# Patient Record
Sex: Male | Born: 1980 | Race: White | Hispanic: No | Marital: Married | Smoking: Never smoker
Health system: Southern US, Community
[De-identification: ages and names within clinical notes are randomized; demographics above are authoritative.]

## PROBLEM LIST (undated history)

## (undated) DIAGNOSIS — D649 Anemia, unspecified: Secondary | ICD-10-CM

## (undated) HISTORY — DX: Anemia, unspecified: D64.9

## (undated) HISTORY — PX: MUSCLE BIOPSY: SHX716

---
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
## 1999-10-04 ENCOUNTER — Encounter: Payer: Self-pay | Admitting: Emergency Medicine

## 1999-10-04 ENCOUNTER — Emergency Department (HOSPITAL_COMMUNITY): Admission: EM | Admit: 1999-10-04 | Discharge: 1999-10-04 | Payer: Self-pay | Admitting: Emergency Medicine

## 1999-11-05 ENCOUNTER — Encounter: Payer: Self-pay | Admitting: Emergency Medicine

## 1999-11-05 ENCOUNTER — Emergency Department (HOSPITAL_COMMUNITY): Admission: EM | Admit: 1999-11-05 | Discharge: 1999-11-05 | Payer: Self-pay | Admitting: Emergency Medicine

## 2003-05-15 ENCOUNTER — Emergency Department (HOSPITAL_COMMUNITY): Admission: EM | Admit: 2003-05-15 | Discharge: 2003-05-15 | Payer: Self-pay | Admitting: Emergency Medicine

## 2003-05-26 ENCOUNTER — Emergency Department (HOSPITAL_COMMUNITY): Admission: EM | Admit: 2003-05-26 | Discharge: 2003-05-27 | Payer: Self-pay | Admitting: Emergency Medicine

## 2003-12-21 ENCOUNTER — Other Ambulatory Visit: Payer: Self-pay

## 2004-01-29 ENCOUNTER — Emergency Department (HOSPITAL_COMMUNITY): Admission: AD | Admit: 2004-01-29 | Discharge: 2004-01-29 | Payer: Self-pay | Admitting: Family Medicine

## 2004-01-30 ENCOUNTER — Other Ambulatory Visit (HOSPITAL_COMMUNITY): Admission: RE | Admit: 2004-01-30 | Discharge: 2004-02-02 | Payer: Self-pay | Admitting: Psychiatry

## 2004-02-18 ENCOUNTER — Emergency Department (HOSPITAL_COMMUNITY): Admission: EM | Admit: 2004-02-18 | Discharge: 2004-02-18 | Payer: Self-pay | Admitting: Emergency Medicine

## 2004-04-09 ENCOUNTER — Emergency Department (HOSPITAL_COMMUNITY): Admission: EM | Admit: 2004-04-09 | Discharge: 2004-04-09 | Payer: Self-pay | Admitting: *Deleted

## 2004-07-25 ENCOUNTER — Emergency Department (HOSPITAL_COMMUNITY): Admission: EM | Admit: 2004-07-25 | Discharge: 2004-07-26 | Payer: Self-pay | Admitting: Emergency Medicine

## 2004-10-13 ENCOUNTER — Emergency Department: Payer: Self-pay | Admitting: Emergency Medicine

## 2005-04-13 ENCOUNTER — Emergency Department: Payer: Self-pay | Admitting: Internal Medicine

## 2005-07-16 ENCOUNTER — Emergency Department: Payer: Self-pay | Admitting: Emergency Medicine

## 2005-10-08 ENCOUNTER — Emergency Department: Payer: Self-pay | Admitting: Emergency Medicine

## 2006-06-16 ENCOUNTER — Emergency Department: Payer: Self-pay | Admitting: Emergency Medicine

## 2008-02-03 ENCOUNTER — Emergency Department: Payer: Self-pay | Admitting: Emergency Medicine

## 2008-02-03 ENCOUNTER — Other Ambulatory Visit: Payer: Self-pay

## 2008-02-08 ENCOUNTER — Ambulatory Visit: Payer: Self-pay | Admitting: Cardiology

## 2008-08-04 ENCOUNTER — Emergency Department: Payer: Self-pay | Admitting: Emergency Medicine

## 2008-10-01 ENCOUNTER — Inpatient Hospital Stay: Payer: Self-pay | Admitting: Internal Medicine

## 2009-10-31 ENCOUNTER — Ambulatory Visit: Payer: Self-pay | Admitting: Family Medicine

## 2010-01-15 ENCOUNTER — Emergency Department: Payer: Self-pay | Admitting: Emergency Medicine

## 2011-03-08 NOTE — Assessment & Plan Note (Signed)
[facility] OFFICE NOTE   Drew, [name]                      MRN:          [id number]  DATE:02/08/2008                            DOB:          June 21, 1981    Drew [name] comes in today to be introduced to [place] before we do a stress  adenosine Myoview at [facility] on Monday, February 11, 2008.   In brief, he is a 30 year old gentleman who presented to the emergency  room at [facility].  He complained of atypical chest pain.   His cardiac markers and D-dimer were negative.  He had a  normal EKG.  It was arranged through Dr. Myrtis [name] who was on call to have him set up for  a stress Myoview.   He is morbidly obese at 411.  He has no other risk factors at the  present time, except that he is fairly sedentary.  He fortunately does  not smoke and does not drink excessively.  He does not have hypertension  or diabetes yet.   MEDICATIONS:  His only medicine is fish 1 t.i.d.   PHYSICAL EXAMINATION:  VITAL SIGNS:  His blood pressure 110/60, pulse 84  and regular.  Weight is 411.  CARDIAC:  The rest of the exam is unremarkable, including a regular rate  and rhythm.  No obvious murmur.  LUNGS:  Clear.  EXTREMITIES:  Trace edema.  He has some brawny changes already.  Pulses  were intact in his lower extremities.   His EKG is normal.   I have discussed at length the indications for the stress Myoview.  I  suspect it will be normal as I told him, though he does have an  increased risk based on his weight.  I will also have an idea of what  his LV function is like.   I spent a lot of time talking about therapeutic lifestyle choices.  Hopefully he will buy into that.     Drew C. Daleen Squibb, MD, [facility]  Electronically Signed    TCW/MedQ  DD: 02/08/2008  DT: 02/08/2008  Job #: [id number]   cc:   Ileana Roup, M.D.

## 2011-03-08 NOTE — Assessment & Plan Note (Signed)
[facility]                            CARDIOLOGY OFFICE NOTE   Drew [name], Drew [name]                        MRN:          [id number]  DATE:02/04/2008                            DOB:          [birth year]    PATIENT IDENTIFICATION:  Medical record number at [place] is  [phone].  The patient's cell phone number [phone].  The patient's  mother's cell phone number [phone].   HISTORY OF PRESENT ILLNESS:  Mr. Drew [name] went to [facility]  emergency room on the evening of February 03, 2008.  He had chest pain that  was felt to be atypical.  There was an emotional component to it.  D-  dimer was normal and enzymes were normal.  I received a call asking that  we arrange for a follow-up outpatient exercise test.  After careful  discussion with Dr. Gala Romney about the planning, I have been in touch  with Tiffany in the office in [facility] this morning.  She will call  the patient and will arrange for an adenosine Myoview scan.  It will be  done either in the [place] office or at [facility].  After  that, the patient will have a follow-up office visit in the [place]  office to follow up the results.     Luis Abed, MD, [facility]  Electronically Signed    JDK/MedQ  DD: 02/04/2008  DT: 02/04/2008  Job #: [id number]

## 2011-08-04 ENCOUNTER — Ambulatory Visit (INDEPENDENT_AMBULATORY_CARE_PROVIDER_SITE_OTHER): Payer: BC Managed Care – PPO | Admitting: Surgery

## 2011-08-26 ENCOUNTER — Ambulatory Visit (INDEPENDENT_AMBULATORY_CARE_PROVIDER_SITE_OTHER): Payer: BC Managed Care – PPO | Admitting: Surgery

## 2011-10-04 ENCOUNTER — Emergency Department: Payer: Self-pay | Admitting: Unknown Physician Specialty

## 2011-10-12 ENCOUNTER — Ambulatory Visit: Payer: Self-pay | Admitting: Physician Assistant

## 2011-11-02 ENCOUNTER — Encounter (INDEPENDENT_AMBULATORY_CARE_PROVIDER_SITE_OTHER): Payer: Self-pay | Admitting: General Surgery

## 2011-11-03 ENCOUNTER — Ambulatory Visit (INDEPENDENT_AMBULATORY_CARE_PROVIDER_SITE_OTHER): Payer: BC Managed Care – PPO | Admitting: Surgery

## 2011-11-03 ENCOUNTER — Encounter (INDEPENDENT_AMBULATORY_CARE_PROVIDER_SITE_OTHER): Payer: Self-pay | Admitting: Surgery

## 2011-11-03 VITALS — BP 142/90 | HR 68 | Temp 97.2°F | Resp 26 | Ht 67.0 in | Wt >= 6400 oz

## 2011-11-03 DIAGNOSIS — E669 Obesity, unspecified: Secondary | ICD-10-CM | POA: Insufficient documentation

## 2011-11-03 NOTE — Progress Notes (Signed)
Addended by: Latricia Heft on: 11/03/2011 11:14 AM   Modules accepted: Orders

## 2011-11-03 NOTE — Progress Notes (Signed)
Chief Complaint:  Morbid obesity BMI 63  History of Present Illness:  Drew [name] is an 31 y.o. male who has been to 2 of our seminars presents interested in having a lap band. He is followed by Dr. Yates Decamp at the [facility] in [place]. He has been big all of his life. 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  He understands the lap band and has done his research and wants to proceed. I explained the procedure again and we would need to preoperatively. He's never been checked for sleep apnea but certainly looks like he would be one who could easily have that. He has occasional heartburn but has never had an upper GI. We'll go ahead and order the studies.  Past Medical History  Diagnosis Date  . Anemia   . Migraine     Past Surgical History  Procedure Date  . Muscle biopsy     No current outpatient prescriptions on file.   Review of patient's allergies indicates no known allergies. Family History  Problem Relation Age of Onset  . Diabetes Mother   . Heart disease Father   . Diabetes Father   . Cancer Paternal Aunt     bone  . Cancer Paternal Grandmother     pt unaware of what kind  . Cancer Paternal Grandfather     pt unaware of what kind   Social History:   reports that he has never smoked. He does not have any smokeless tobacco history on file. He reports that he drinks alcohol. He reports that he does not use illicit drugs.   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 The only thing he takes his Coumadin 3 fatty acids. He is married with one child he does not smoke occasionally drinks. His review of systems is positive for joint pain arthritis and he reports it otherwise to be normal.  No history of DVT  Physical Exam:   Blood pressure 142/90, pulse 68, temperature 97.2 F (36.2 C), temperature source Temporal, resp. rate 26, height 5\' 7"  (1.702 m), weight 401 lb  9.6 oz (182.165 kg). Body mass index is 62.90 kg/(m^2).  Gen:  WDWN Wmale NAD  Neurological: Alert and oriented to person, place, and time. Motor and sensory function is grossly intact  Head: Normocephalic and atraumatic.  Eyes: Conjunctivae are normal. Pupils are equal, round, and reactive to light. No scleral icterus.  Neck: Normal range of motion. Neck supple. No tracheal deviation or thyromegaly present.  Cardiovascular:  SR without murmurs or gallops.  No carotid bruits Respiratory: Effort normal.  No respiratory distress. No chest wall tenderness. Breath sounds normal.  No wheezes, rales or rhonchi.  Abdomen:  obese GU: Musculoskeletal: Normal range of motion. Extremities are nontender. No cyanosis, edema or clubbing noted Lymphadenopathy: No cervical, preauricular, postauricular or axillary adenopathy is present Skin: Skin is warm and dry. No rash noted. No diaphoresis. No erythema. No pallor. Pscyh: Normal mood and affect. Behavior is normal. Judgment and thought content normal.   LABORATORY RESULTS: No results found for this or any previous visit (from the past 48 hour(s)).  RADIOLOGY RESULTS: No results found.  Problem List: Patient Active Problem List  Diagnoses  . Obesity BMI 63    Assessment & Plan: Broad chested man with BMI 63 who desires lapband.  Will begin the workup.    Matt B. Daphine Deutscher, MD,  [facility] [phone] beeper [phone]  11/03/2011 10:44 AM

## 2011-11-03 NOTE — Patient Instructions (Signed)
Allow Drew [name] to schedule your tests

## 2011-11-09 ENCOUNTER — Other Ambulatory Visit (INDEPENDENT_AMBULATORY_CARE_PROVIDER_SITE_OTHER): Payer: Self-pay | Admitting: General Surgery

## 2011-11-14 ENCOUNTER — Ambulatory Visit: Payer: Self-pay | Admitting: *Deleted

## 2011-11-17 ENCOUNTER — Encounter (HOSPITAL_COMMUNITY): Payer: Self-pay

## 2011-11-18 ENCOUNTER — Other Ambulatory Visit (HOSPITAL_COMMUNITY): Payer: BC Managed Care – PPO

## 2011-11-18 ENCOUNTER — Inpatient Hospital Stay (HOSPITAL_COMMUNITY): Admission: RE | Admit: 2011-11-18 | Payer: BC Managed Care – PPO | Source: Ambulatory Visit

## 2011-11-21 ENCOUNTER — Ambulatory Visit (HOSPITAL_COMMUNITY): Admission: RE | Admit: 2011-11-21 | Payer: BC Managed Care – PPO | Source: Ambulatory Visit | Admitting: Surgery

## 2011-11-21 SURGERY — BREATH TEST, FOR HELICOBACTER PYLORI

## 2011-11-28 ENCOUNTER — Ambulatory Visit: Payer: Self-pay | Admitting: *Deleted

## 2011-12-06 ENCOUNTER — Ambulatory Visit (HOSPITAL_COMMUNITY)
Admission: RE | Admit: 2011-12-06 | Payer: BC Managed Care – PPO | Source: Ambulatory Visit | Admitting: Gastroenterology

## 2011-12-06 ENCOUNTER — Encounter (HOSPITAL_COMMUNITY): Admission: RE | Payer: Self-pay | Source: Ambulatory Visit

## 2011-12-06 SURGERY — BREATH TEST, FOR HELICOBACTER PYLORI

## 2011-12-16 ENCOUNTER — Ambulatory Visit (HOSPITAL_BASED_OUTPATIENT_CLINIC_OR_DEPARTMENT_OTHER): Payer: BC Managed Care – PPO

## 2011-12-31 ENCOUNTER — Emergency Department: Payer: Self-pay | Admitting: Internal Medicine

## 2012-01-25 ENCOUNTER — Ambulatory Visit: Payer: Self-pay | Admitting: Orthopedic Surgery

## 2012-06-30 ENCOUNTER — Ambulatory Visit: Payer: Self-pay | Admitting: Orthopedic Surgery

## 2012-07-02 ENCOUNTER — Ambulatory Visit: Payer: Self-pay | Admitting: Orthopedic Surgery

## 2012-12-19 IMAGING — CR DG LUMBAR SPINE 2-3V
1 series · 3 of 3 positions shown · non-contrast
Comparison: none

REASON FOR EXAM: acute back pain
COMMENTS:   LMP: (Male)

PROCEDURE:     DXR - DXR LUMBAR SPINE AP AND LATERAL  - December 31, 2011  [DATE]
RESULT:     Five non-rib bearing lumbar vertebral bodies are appreciated.
There is no evidence of fracture, dislocation or malalignment.

[Series 1: t lumbar spine ap · 0.14mm/px · 3 of 3 slices shown]
[im 1/3]
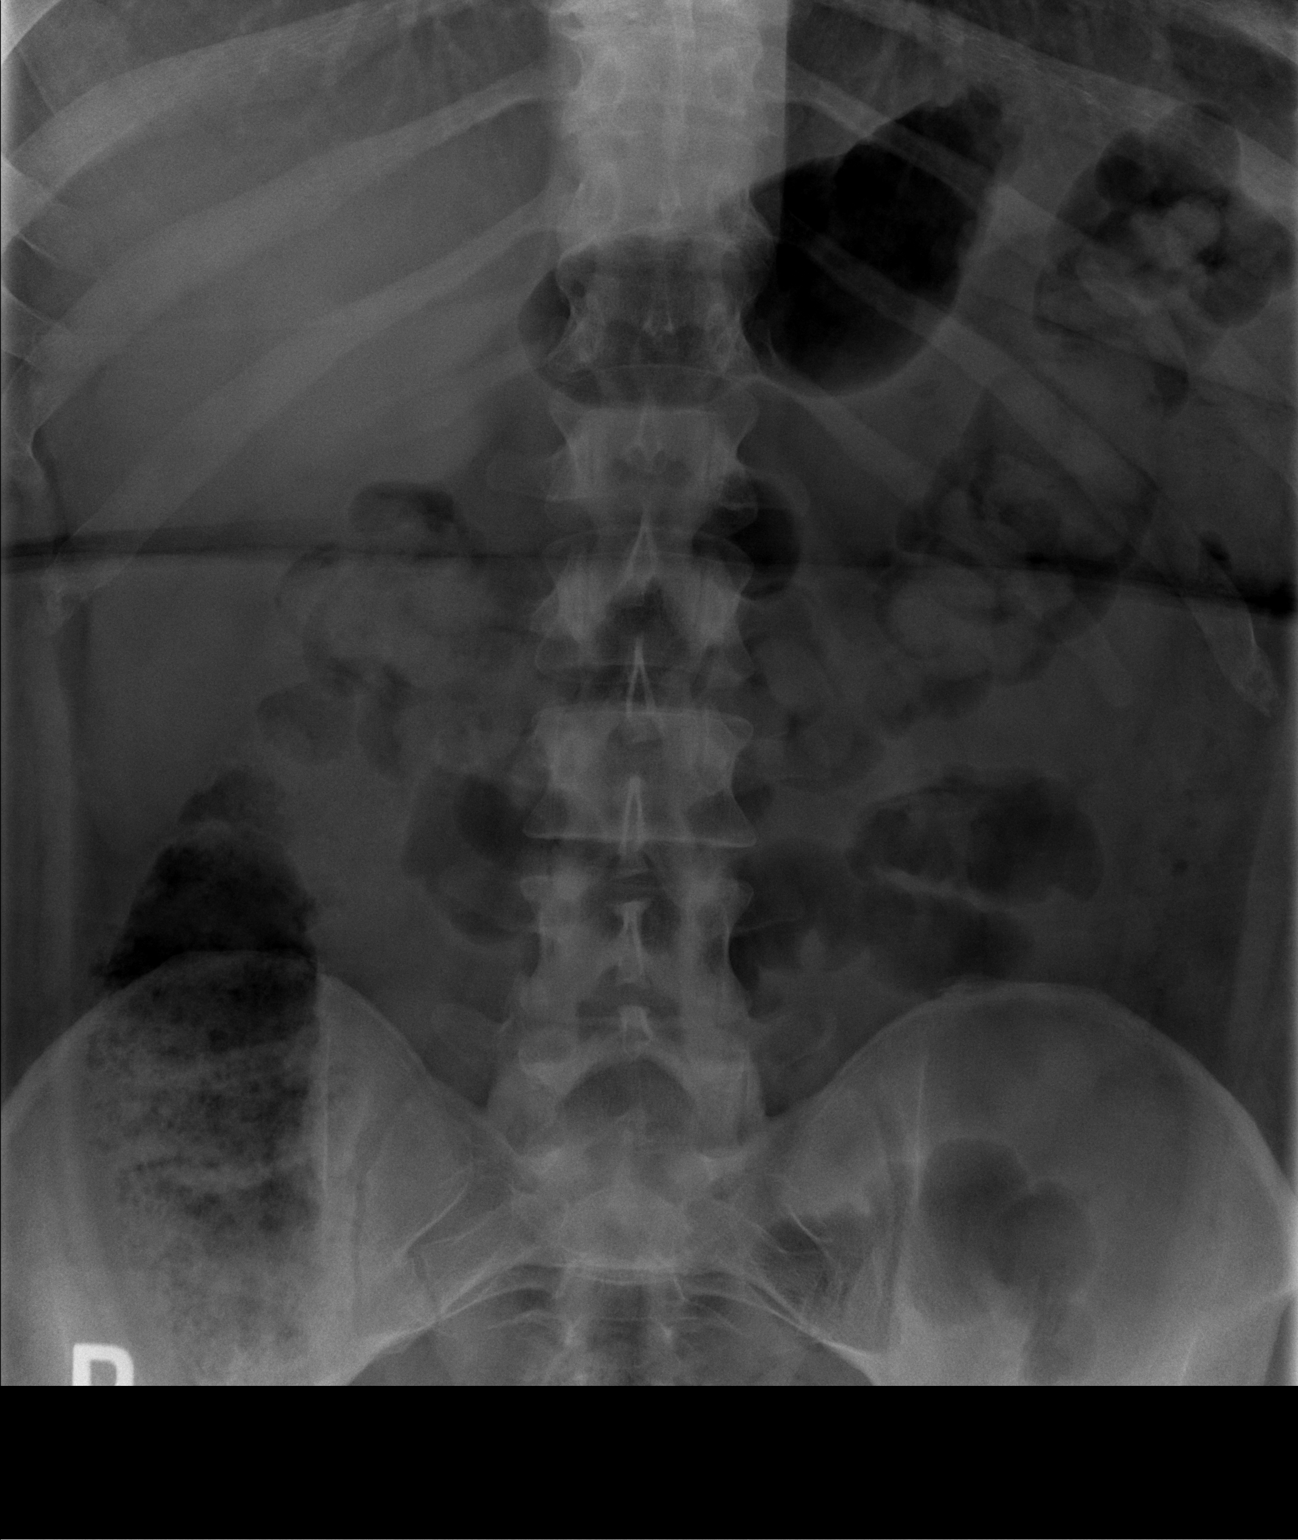
[im 2/3]
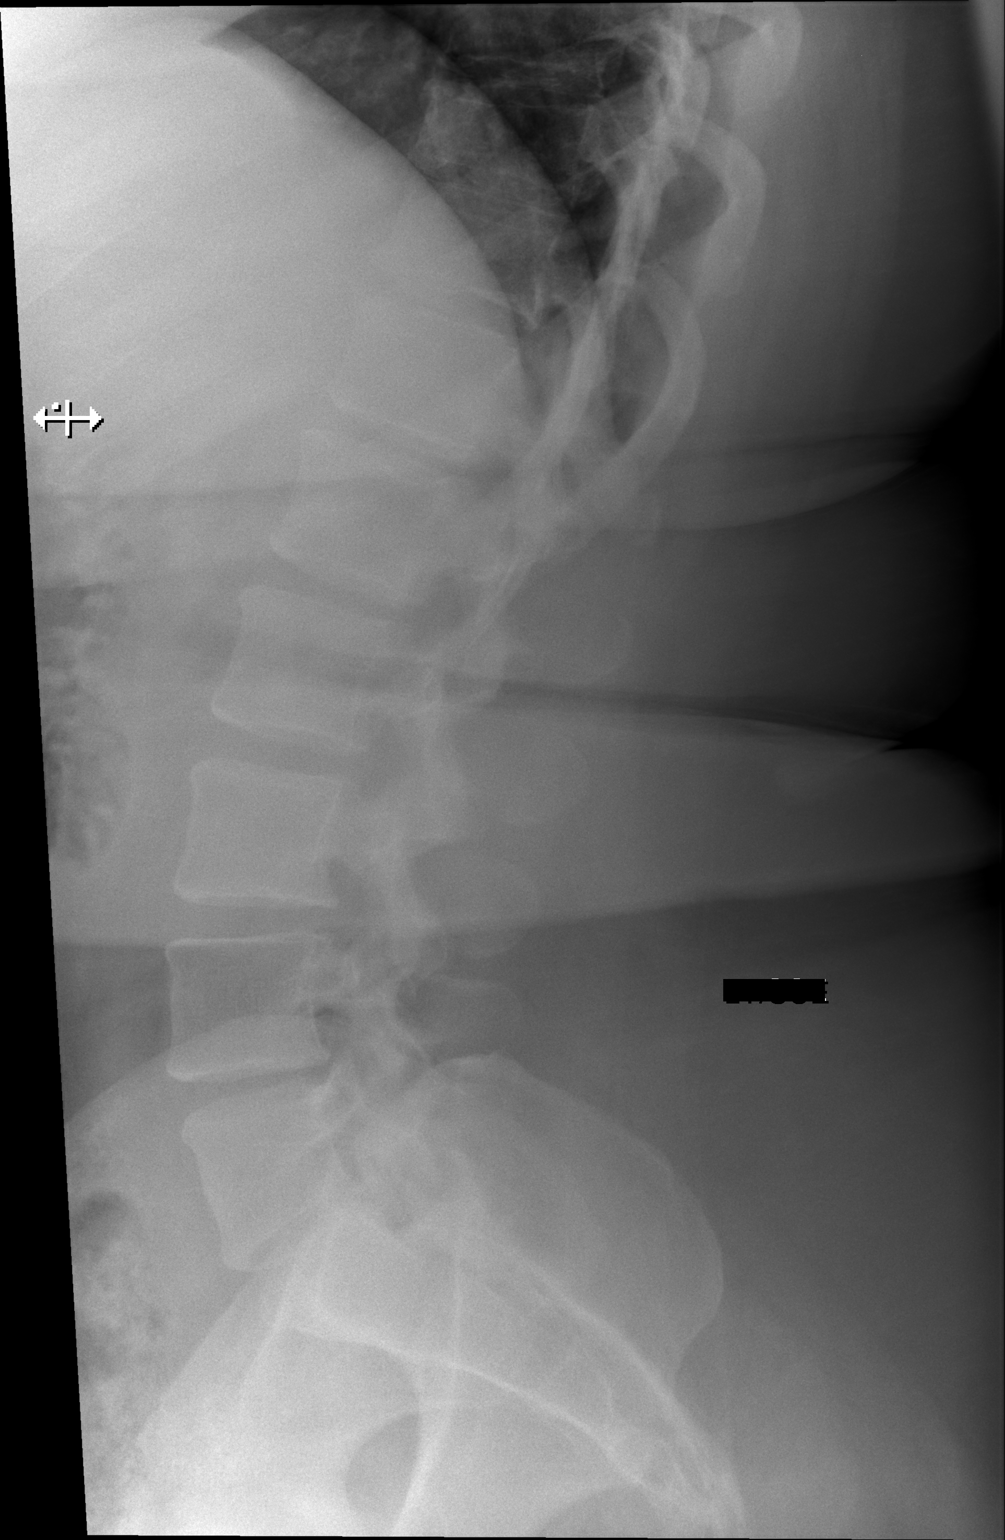
[im 3/3]
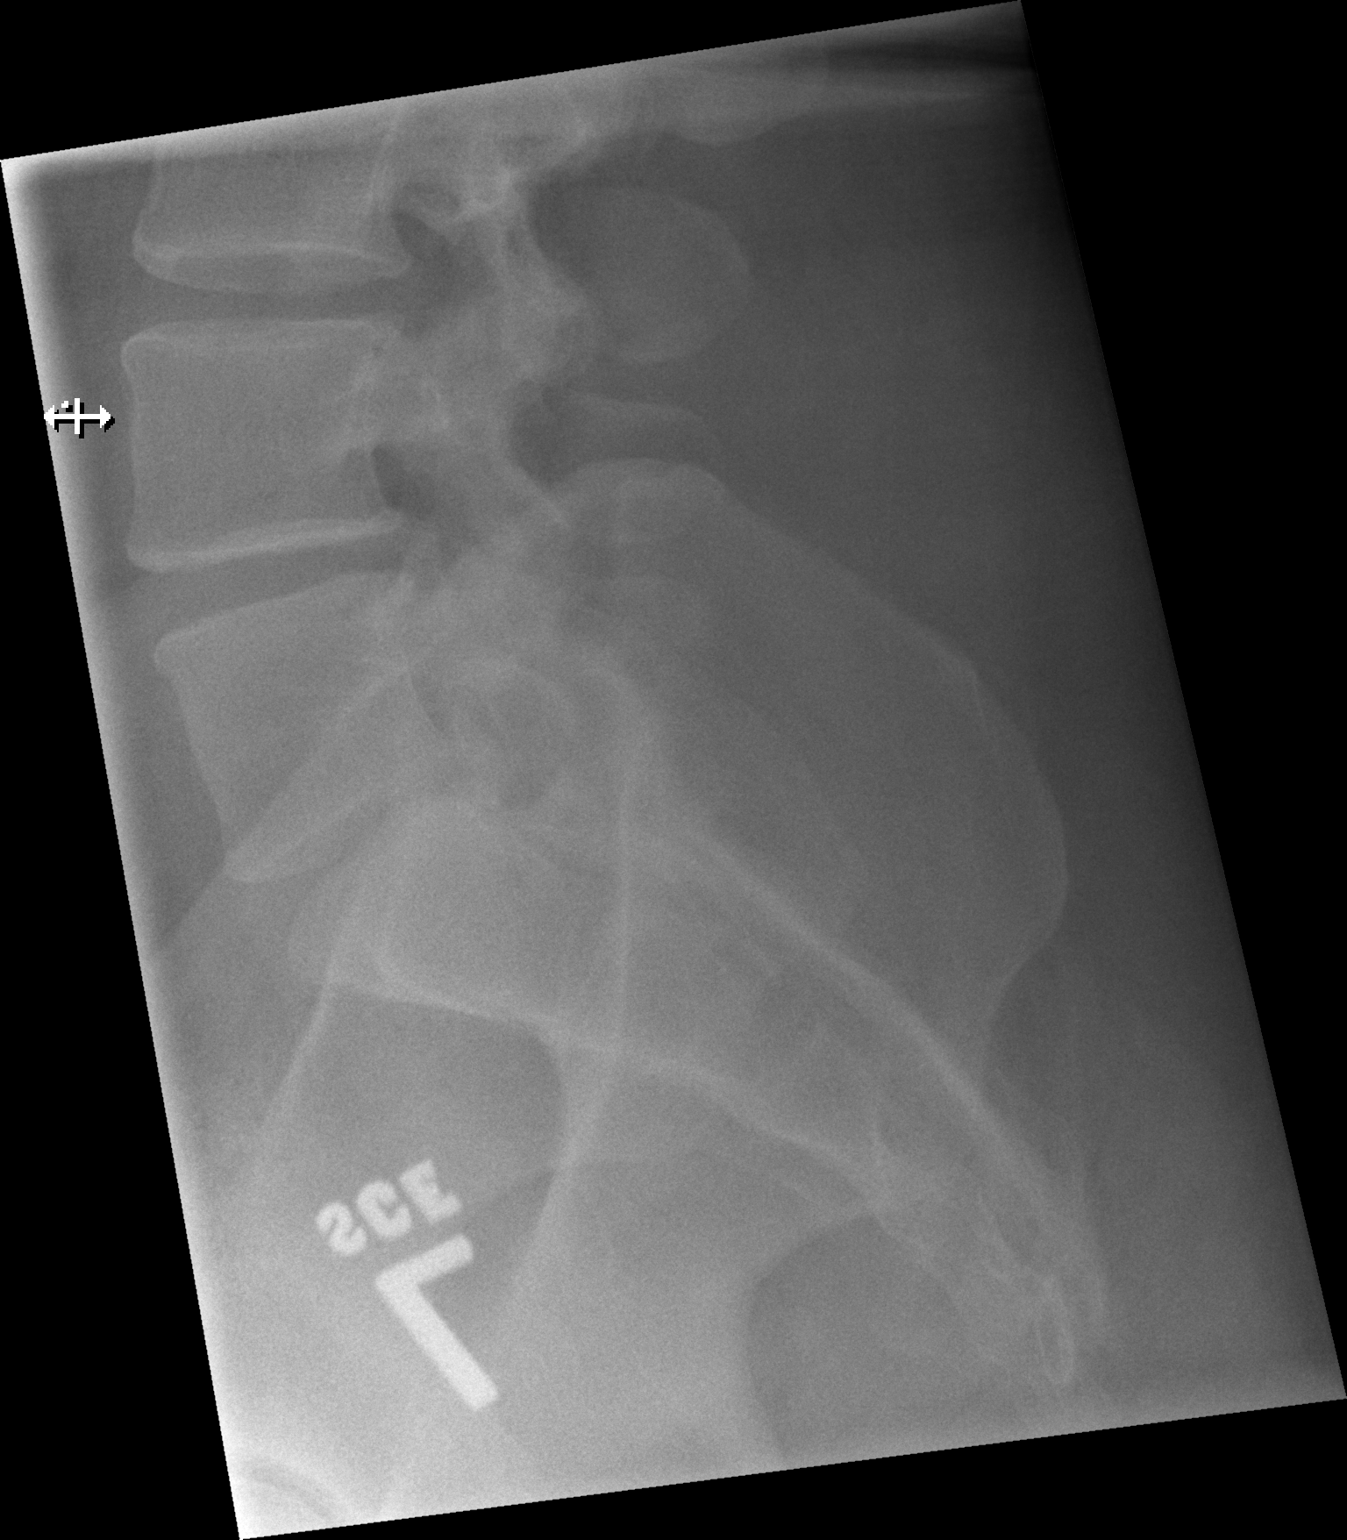

[3 of 3 positions shown; findings below may reference images not displayed]

IMPRESSION: 1. No acute osseous abnormalities.
2. If there is persistent clinical concern or persistent complaints of pain,
further evaluation with MRI is recommended.

## 2013-11-03 ENCOUNTER — Emergency Department: Payer: Self-pay | Admitting: Emergency Medicine

## 2013-11-16 ENCOUNTER — Emergency Department: Payer: Self-pay | Admitting: Internal Medicine

## 2013-11-16 LAB — COMPREHENSIVE METABOLIC PANEL
ALBUMIN: 3.6 g/dL (ref 3.4–5.0)
ALK PHOS: 49 U/L
ALT: 65 U/L (ref 12–78)
ANION GAP: 6 — AB (ref 7–16)
BILIRUBIN TOTAL: 0.7 mg/dL (ref 0.2–1.0)
BUN: 10 mg/dL (ref 7–18)
CALCIUM: 8.9 mg/dL (ref 8.5–10.1)
CHLORIDE: 103 mmol/L (ref 98–107)
CO2: 26 mmol/L (ref 21–32)
CREATININE: 0.67 mg/dL (ref 0.60–1.30)
GLUCOSE: 126 mg/dL — AB (ref 65–99)
OSMOLALITY: 271 (ref 275–301)
Potassium: 4.4 mmol/L (ref 3.5–5.1)
SGOT(AST): 55 U/L — ABNORMAL HIGH (ref 15–37)
Sodium: 135 mmol/L — ABNORMAL LOW (ref 136–145)
Total Protein: 7.5 g/dL (ref 6.4–8.2)

## 2013-11-16 LAB — CBC
HCT: 42.8 % (ref 40.0–52.0)
HGB: 14.6 g/dL (ref 13.0–18.0)
MCH: 30.7 pg (ref 26.0–34.0)
MCHC: 34.2 g/dL (ref 32.0–36.0)
MCV: 90 fL (ref 80–100)
PLATELETS: 220 10*3/uL (ref 150–440)
RBC: 4.76 10*6/uL (ref 4.40–5.90)
RDW: 13.2 % (ref 11.5–14.5)
WBC: 7.4 10*3/uL (ref 3.8–10.6)

## 2013-11-16 LAB — TROPONIN I

## 2013-11-16 LAB — CK TOTAL AND CKMB (NOT AT ARMC)
CK, Total: 229 U/L (ref 35–232)
CK-MB: 1 ng/mL (ref 0.5–3.6)

## 2013-12-04 ENCOUNTER — Emergency Department: Payer: Self-pay | Admitting: Internal Medicine

## 2014-05-07 ENCOUNTER — Ambulatory Visit: Payer: Self-pay | Admitting: Internal Medicine

## 2014-05-07 LAB — URINALYSIS, COMPLETE
BILIRUBIN, UR: NEGATIVE
GLUCOSE, UR: NEGATIVE mg/dL (ref 0–75)
KETONE: NEGATIVE
Nitrite: NEGATIVE
Ph: 6 (ref 4.5–8.0)
Protein: NEGATIVE
Specific Gravity: >=1.03 (ref 1.003–1.030)
WBC UR: >30 /HPF (ref 0–5)

## 2014-05-09 LAB — URINE CULTURE

## 2014-05-17 ENCOUNTER — Emergency Department: Payer: Self-pay | Admitting: Emergency Medicine

## 2014-08-22 ENCOUNTER — Emergency Department: Payer: Self-pay | Admitting: Emergency Medicine

## 2014-08-22 LAB — COMPREHENSIVE METABOLIC PANEL
ALBUMIN: 3.3 g/dL — AB (ref 3.4–5.0)
ALK PHOS: 42 U/L — AB
Anion Gap: 6 — ABNORMAL LOW (ref 7–16)
BILIRUBIN TOTAL: 0.3 mg/dL (ref 0.2–1.0)
BUN: 11 mg/dL (ref 7–18)
CALCIUM: 8.4 mg/dL — AB (ref 8.5–10.1)
Chloride: 107 mmol/L (ref 98–107)
Co2: 28 mmol/L (ref 21–32)
Creatinine: 0.72 mg/dL (ref 0.60–1.30)
EGFR (African American): >60
EGFR (Non-African Amer.): >60
Glucose: 115 mg/dL — ABNORMAL HIGH (ref 65–99)
OSMOLALITY: 282 (ref 275–301)
POTASSIUM: 4.3 mmol/L (ref 3.5–5.1)
SGOT(AST): 38 U/L — ABNORMAL HIGH (ref 15–37)
SGPT (ALT): 61 U/L
SODIUM: 141 mmol/L (ref 136–145)
TOTAL PROTEIN: 7 g/dL (ref 6.4–8.2)

## 2014-08-22 LAB — LIPASE, BLOOD: LIPASE: 82 U/L (ref 73–393)

## 2014-08-22 LAB — CBC
HCT: 42.1 % (ref 40.0–52.0)
HGB: 13.9 g/dL (ref 13.0–18.0)
MCH: 30.1 pg (ref 26.0–34.0)
MCHC: 33 g/dL (ref 32.0–36.0)
MCV: 91 fL (ref 80–100)
PLATELETS: 177 10*3/uL (ref 150–440)
RBC: 4.61 10*6/uL (ref 4.40–5.90)
RDW: 13.2 % (ref 11.5–14.5)
WBC: 6.3 10*3/uL (ref 3.8–10.6)

## 2014-11-07 ENCOUNTER — Ambulatory Visit: Payer: Self-pay | Admitting: Family Medicine

## 2014-12-04 ENCOUNTER — Emergency Department: Payer: Self-pay | Admitting: Emergency Medicine

## 2015-12-14 ENCOUNTER — Other Ambulatory Visit: Payer: Self-pay | Admitting: Specialist

## 2015-12-14 DIAGNOSIS — M5116 Intervertebral disc disorders with radiculopathy, lumbar region: Secondary | ICD-10-CM

## 2015-12-14 DIAGNOSIS — R1011 Right upper quadrant pain: Secondary | ICD-10-CM

## 2015-12-17 ENCOUNTER — Ambulatory Visit
Admission: RE | Admit: 2015-12-17 | Discharge: 2015-12-17 | Disposition: A | Discharge status: Home / Self Care | Payer: BC Managed Care – PPO | Source: Ambulatory Visit | Attending: Specialist | Admitting: Specialist

## 2015-12-17 DIAGNOSIS — M5116 Intervertebral disc disorders with radiculopathy, lumbar region: Secondary | ICD-10-CM

## 2015-12-17 DIAGNOSIS — R1011 Right upper quadrant pain: Secondary | ICD-10-CM

## 2016-01-06 ENCOUNTER — Ambulatory Visit: Payer: BC Managed Care – PPO | Attending: Neurology

## 2016-01-06 DIAGNOSIS — G4733 Obstructive sleep apnea (adult) (pediatric): Secondary | ICD-10-CM | POA: Insufficient documentation

## 2016-02-03 ENCOUNTER — Ambulatory Visit: Payer: BC Managed Care – PPO | Attending: Neurology

## 2016-02-03 DIAGNOSIS — G4733 Obstructive sleep apnea (adult) (pediatric): Secondary | ICD-10-CM | POA: Diagnosis not present

## 2017-12-30 ENCOUNTER — Encounter (HOSPITAL_COMMUNITY): Payer: Self-pay | Admitting: Family Medicine

## 2017-12-30 ENCOUNTER — Ambulatory Visit (HOSPITAL_COMMUNITY)
Admission: EM | Admit: 2017-12-30 | Discharge: 2017-12-30 | Disposition: A | Discharge status: Home / Self Care | Payer: BC Managed Care – PPO | Attending: Family Medicine | Admitting: Family Medicine

## 2017-12-30 DIAGNOSIS — J0101 Acute recurrent maxillary sinusitis: Secondary | ICD-10-CM | POA: Diagnosis not present

## 2017-12-30 MED ORDER — AMOXICILLIN-POT CLAVULANATE 875-125 MG PO TABS: 1.0000 | ORAL_TABLET | Freq: Two times a day (BID) | ORAL | 0 refills | Status: AC

## 2017-12-30 NOTE — ED Triage Notes (Signed)
Pt here for 4 days of flu like symptoms.

## 2017-12-30 NOTE — Discharge Instructions (Signed)
OK to take Tylenol 1000 mg (2 extra strength tabs) or 975 mg (3 regular strength tabs) every 6 hours as needed.  Ice/cold pack over area for 10-15 min twice daily.  Continue to push fluids, practice good hand hygiene, and cover your mouth if you cough.  If you start having fevers, shaking or shortness of breath, seek immediate care.

## 2017-12-30 NOTE — ED Provider Notes (Signed)
  [facility]    CSN: [id number] Arrival date & time: 12/30/17  1726  Chief Complaint  Patient presents with  . Generalized Body Aches  . Headache    Purcell NailsMichael S Charters here for URI complaints.  Duration: 4 days  Associated symptoms: fevers of 101 F, sinus congestion, sinus pain, rhinorrhea, ear fullness and myalgia Denies: itchy watery eyes, ear pain, ear drainage, sore throat, wheezing, shortness of breath and cough Treatment to date: Tylenol Sick contacts: Yes  ROS:  Const: +fevers HEENT: As noted in HPI Lungs: No SOB  Past Medical History:  Diagnosis Date  . Anemia   . Migraine    Family History  Problem Relation Age of Onset  . Diabetes Mother   . Heart disease Father   . Diabetes Father   . Cancer Paternal Aunt        bone  . Cancer Paternal Grandmother        pt unaware of what kind  . Cancer Paternal Grandfather        pt unaware of what kind    BP 135/80   Pulse 64   Temp 98.1 F (36.7 C)   Resp 18   Wt 200 lb (90.7 kg)   SpO2 100%   BMI 31.32 kg/m  General: Awake, alert, appears stated age HEENT: AT, [place], ears patent b/l and TM's neg, nares patent w/o discharge, +TTP over max sinuses b/l, pharynx pink and without exudates, MMM Neck: No masses or asymmetry Heart: RRR Lungs: CTAB, no accessory muscle use Psych: Age appropriate judgment and insight, normal mood and affect  Acute recurrent maxillary sinusitis  Augmentin 875-125 mg BID, 10 d, Tx given fevers (had recently used Tylenol) Continue to push fluids, practice good hand hygiene, cover mouth when coughing. F/u prn. If starting to experience fevers, shaking, or shortness of breath, seek immediate care. Pt voiced understanding and agreement to the plan.    Sharlene DoryWendling, Nicholas Paul, [place] 12/30/17 2130

## 2018-10-03 ENCOUNTER — Encounter: Payer: Self-pay | Admitting: Emergency Medicine

## 2018-10-03 ENCOUNTER — Emergency Department: Payer: 59

## 2018-10-03 ENCOUNTER — Emergency Department
Admission: EM | Admit: 2018-10-03 | Discharge: 2018-10-03 | Disposition: A | Discharge status: Home / Self Care | Payer: 59 | Attending: Emergency Medicine | Admitting: Emergency Medicine

## 2018-10-03 DIAGNOSIS — R1031 Right lower quadrant pain: Secondary | ICD-10-CM | POA: Insufficient documentation

## 2018-10-03 DIAGNOSIS — R197 Diarrhea, unspecified: Secondary | ICD-10-CM | POA: Diagnosis not present

## 2018-10-03 DIAGNOSIS — R109 Unspecified abdominal pain: Secondary | ICD-10-CM | POA: Diagnosis present

## 2018-10-03 DIAGNOSIS — R112 Nausea with vomiting, unspecified: Secondary | ICD-10-CM | POA: Diagnosis not present

## 2018-10-03 LAB — COMPREHENSIVE METABOLIC PANEL
ALT: 18 U/L (ref 0–44)
AST: 21 U/L (ref 15–41)
Albumin: 3.5 g/dL (ref 3.5–5.0)
Alkaline Phosphatase: 78 U/L (ref 38–126)
Anion gap: 6 (ref 5–15)
BUN: 11 mg/dL (ref 6–20)
CHLORIDE: 106 mmol/L (ref 98–111)
CO2: 28 mmol/L (ref 22–32)
Calcium: 8.3 mg/dL — ABNORMAL LOW (ref 8.9–10.3)
Creatinine, Ser: 0.55 mg/dL — ABNORMAL LOW (ref 0.61–1.24)
GFR calc Af Amer: >60 mL/min (ref >60)
GFR calc non Af Amer: >60 mL/min (ref >60)
Glucose, Bld: 97 mg/dL (ref 70–99)
Potassium: 3.3 mmol/L — ABNORMAL LOW (ref 3.5–5.1)
Sodium: 140 mmol/L (ref 135–145)
TOTAL PROTEIN: 6.1 g/dL — AB (ref 6.5–8.1)
Total Bilirubin: 0.9 mg/dL (ref 0.3–1.2)

## 2018-10-03 LAB — CBC
HCT: 39.1 % (ref 39.0–52.0)
Hemoglobin: 13.1 g/dL (ref 13.0–17.0)
MCH: 30.5 pg (ref 26.0–34.0)
MCHC: 33.5 g/dL (ref 30.0–36.0)
MCV: 91.1 fL (ref 80.0–100.0)
Platelets: 211 10*3/uL (ref 150–400)
RBC: 4.29 MIL/uL (ref 4.22–5.81)
RDW: 13.5 % (ref 11.5–15.5)
WBC: 7.2 10*3/uL (ref 4.0–10.5)
nRBC: 0 % (ref 0.0–0.2)

## 2018-10-03 LAB — URINALYSIS, COMPLETE (UACMP) WITH MICROSCOPIC
Bacteria, UA: NONE SEEN
Bilirubin Urine: NEGATIVE
Glucose, UA: NEGATIVE mg/dL
Hgb urine dipstick: NEGATIVE
Ketones, ur: NEGATIVE mg/dL
Nitrite: NEGATIVE
Protein, ur: NEGATIVE mg/dL
Specific Gravity, Urine: 1.018 (ref 1.005–1.030)
pH: 5 (ref 5.0–8.0)

## 2018-10-03 LAB — CG4 I-STAT (LACTIC ACID): Lactic Acid, Venous: 0.45 mmol/L — ABNORMAL LOW (ref 0.5–1.9)

## 2018-10-03 LAB — LIPASE, BLOOD: Lipase: 26 U/L (ref 11–51)

## 2018-10-03 MED ORDER — FENTANYL CITRATE (PF) 100 MCG/2ML IJ SOLN
50.0000 ug | Freq: Once | INTRAMUSCULAR | Status: AC
  Administered 2018-10-03: 50 ug via INTRAVENOUS
  Filled 2018-10-03: qty 2

## 2018-10-03 MED ORDER — IOPAMIDOL (ISOVUE-300) INJECTION 61%
100.0000 mL | Freq: Once | INTRAVENOUS | Status: AC | PRN
  Administered 2018-10-03: 100 mL via INTRAVENOUS

## 2018-10-03 MED ORDER — ONDANSETRON HCL 4 MG/2ML IJ SOLN
4.0000 mg | Freq: Once | INTRAMUSCULAR | Status: AC
  Administered 2018-10-03: 4 mg via INTRAVENOUS
  Filled 2018-10-03: qty 2

## 2018-10-03 MED ORDER — SODIUM CHLORIDE 0.9 % IV BOLUS
1000.0000 mL | Freq: Once | INTRAVENOUS | Status: AC
  Administered 2018-10-03: 1000 mL via INTRAVENOUS

## 2018-10-03 MED ORDER — ONDANSETRON 4 MG PO TBDP: 4.0000 mg | ORAL_TABLET | Freq: Three times a day (TID) | ORAL | 0 refills | Status: AC | PRN

## 2018-10-03 NOTE — ED Triage Notes (Signed)
Pt reports he had bariatric surgery in August 2017. Pt reports started with NV a week ago and then abd pain to right side upper and lower. Pt reports pain is intermittent. Pt reports went to his MD today an was advised to come to the ED to rule out an internal hernia.

## 2018-10-03 NOTE — ED Provider Notes (Signed)
[facility] Emergency Department Provider Note  ____________________________________________  Time seen: Approximately 1:43 PM  I have reviewed the triage vital signs and the nursing notes.   HISTORY  Chief Complaint Abdominal Cramping and Nausea   HPI Drew [name] is a 37 y.o. male with a history of bariatric surgery in 2017 who presents for evaluation of abdominal pain.  Patient reports that his symptoms started 5 days ago.  He is complaining of sharp intermittent right lower quadrant abdominal pain associated with nausea, nonbloody nonbilious emesis and diarrhea.  Patient reports that every time he eats something he has a bowel movement and vomits.  No melena, hematochezia, hematemesis, coffee-ground emesis.  No fever or chills, no chest pain or shortness of breath, no dysuria or hematuria.  Patient went to see his geriatric surgeon today, after seeing a PA he was sent to the emergency room for CT to rule out intra-abdominal hernia.  Past Medical History:  Diagnosis Date  . Anemia   . Migraine     Patient Active Problem List   Diagnosis Date Noted  . Obesity BMI 63 11/03/2011    Past Surgical History:  Procedure Laterality Date  . MUSCLE BIOPSY      Prior to Admission medications   Medication Sig Start Date End Date Taking? Authorizing Provider  albuterol (PROVENTIL HFA;VENTOLIN HFA) 108 (90 BASE) MCG/ACT inhaler Inhale 2 puffs into the lungs every 6 (six) hours as needed. For asthma    [provider]  amoxicillin-clavulanate (AUGMENTIN) 875-125 MG tablet Take 1 tablet by mouth 2 (two) times daily. 12/30/17   Sharlene Dory, DO  etodolac (LODINE) 400 MG tablet Take 400 mg by mouth 2 (two) times daily.    [provider]  Omega-3 Fatty Acids (FISH OIL PO) Take 1 capsule by mouth daily.    [provider]  ondansetron (ZOFRAN ODT) 4 MG disintegrating tablet Take 1 tablet (4 mg total) by mouth every 8 (eight)  hours as needed. 10/03/18   Nita Sickle, MD  Pamabrom ([employer] DIURETIC MAXIMUM STRENGTH PO) Take 1 tablet by mouth daily as needed. For water retention.    [provider]    Allergies Nsaids and Oxycodone  Family History  Problem Relation Age of Onset  . Diabetes Mother   . Heart disease Father   . Diabetes Father   . Cancer Paternal Aunt        bone  . Cancer Paternal Grandmother        pt unaware of what kind  . Cancer Paternal Grandfather        pt unaware of what kind    Social History Social History   Tobacco Use  . Smoking status: Never Smoker  Substance Use Topics  . Alcohol use: Yes  . Drug use: No    Review of Systems  Constitutional: Negative for fever. Eyes: Negative for visual changes. ENT: Negative for sore throat. Neck: No neck pain  Cardiovascular: Negative for chest pain. Respiratory: Negative for shortness of breath. Gastrointestinal: + RLQ abdominal pain, vomiting and diarrhea. Genitourinary: Negative for dysuria. Musculoskeletal: Negative for back pain. Skin: Negative for rash. Neurological: Negative for headaches, weakness or numbness. Psych: No SI or HI  ____________________________________________   PHYSICAL EXAM:  VITAL SIGNS: ED Triage Vitals  Enc Vitals Group     BP 10/03/18 1225 122/73     Pulse Rate 10/03/18 1225 (!) 53     Resp 10/03/18 1225 18     Temp  10/03/18 1225 98.4 F (36.9 C)     Temp Source 10/03/18 1225 Oral     SpO2 10/03/18 1225 100 %     Weight 10/03/18 1159 203 lb (92.1 kg)     Height 10/03/18 1159 5\' 7"  (1.702 m)     Head Circumference --      Peak Flow --      Pain Score 10/03/18 1159 6     Pain Loc --      Pain Edu? --      Excl. in GC? --     Constitutional: Alert and oriented. Well appearing and in no apparent distress. HEENT:      Head: Normocephalic and atraumatic.         Eyes: Conjunctivae are normal. Sclera is non-icteric.       Mouth/Throat: Mucous membranes are moist.        Neck: Supple with no signs of meningismus. Cardiovascular: Regular rate and rhythm. No murmurs, gallops, or rubs. 2+ symmetrical distal pulses are present in all extremities. No JVD. Respiratory: Normal respiratory effort. Lungs are clear to auscultation bilaterally. No wheezes, crackles, or rhonchi.  Gastrointestinal: Soft, mild tenderness to palpation over the RLQ, and non distended with positive bowel sounds. No rebound or guarding. Genitourinary: No CVA tenderness. Musculoskeletal: Nontender with normal range of motion in all extremities. No edema, cyanosis, or erythema of extremities. Neurologic: Normal speech and language. Face is symmetric. Moving all extremities. No gross focal neurologic deficits are appreciated. Skin: Skin is warm, dry and intact. No rash noted. Psychiatric: Mood and affect are normal. Speech and behavior are normal.  ____________________________________________   LABS (all labs ordered are listed, but only abnormal results are displayed)  Labs Reviewed  COMPREHENSIVE METABOLIC PANEL - Abnormal; Notable for the following components:      Result Value   Potassium 3.3 (*)    Creatinine, Ser 0.55 (*)    Calcium 8.3 (*)    Total Protein 6.1 (*)    All other components within normal limits  URINALYSIS, COMPLETE (UACMP) WITH MICROSCOPIC - Abnormal; Notable for the following components:   Color, Urine YELLOW (*)    APPearance HAZY (*)    Leukocytes, UA TRACE (*)    All other components within normal limits  CG4 I-STAT (LACTIC ACID) - Abnormal; Notable for the following components:   Lactic Acid, Venous 0.45 (*)    All other components within normal limits  URINE CULTURE  LIPASE, BLOOD  CBC  I-STAT CG4 LACTIC ACID, ED   ____________________________________________  EKG  none  ____________________________________________  RADIOLOGY  I have personally reviewed the images performed during this visit and I agree with the Radiologist's  read.   Interpretation by Radiologist:  Ct Abdomen Pelvis W Contrast  Result Date: 10/03/2018 CLINICAL DATA:  Abdominal pain and history of prior bariatric surgery EXAM: CT ABDOMEN AND PELVIS WITH CONTRAST TECHNIQUE: Multidetector CT imaging of the abdomen and pelvis was performed using the standard protocol following bolus administration of intravenous contrast. CONTRAST:  100mL ISOVUE-300 IOPAMIDOL (ISOVUE-300) INJECTION 61% COMPARISON:  None. FINDINGS: Lower chest: Lung bases are free of acute infiltrate or sizable effusion. Hepatobiliary: Liver is within normal limits. The gallbladder is well distended with dependent density likely related to small gallstones. Pancreas: Unremarkable. No pancreatic ductal dilatation or surrounding inflammatory changes. Spleen: Normal in size without focal abnormality. Adrenals/Urinary Tract: Adrenal glands are within normal limits. Kidneys are well visualized bilaterally. Tiny nonobstructing right renal stone is noted in the lower pole. The  bladder is decompressed. Stomach/Bowel: The appendix is within normal limits without inflammatory change. Colon is unremarkable. Postsurgical changes are noted consistent with the prior bariatric surgery. No obstructive changes are seen. No small bowel dilatation is identified to suggest internal hernia. Oral contrast was not administered for this exam somewhat limiting evaluation. Vascular/Lymphatic: No significant vascular findings are present. No enlarged abdominal or pelvic lymph nodes. Reproductive: Prostate is unremarkable. Other: No abdominal wall hernia or abnormality. No abdominopelvic ascites. Some varicosities are noted along the anterior abdominal wall. Mild subcutaneous edema is seen. Musculoskeletal: Mild degenerative changes of lumbar spine are noted. IMPRESSION: Postsurgical changes are noted consistent with the given clinical history. No obstructive changes are seen. Cholelithiasis without complicating factors. Tiny  right nonobstructing renal stone. Mild varicosities in the abdominal wall with some associated underlying edema. Electronically Signed   By: Alcide Clever M.D.   On: 10/03/2018 15:11      ____________________________________________   PROCEDURES  Procedure(s) performed: None Procedures Critical Care performed:  None ____________________________________________   INITIAL IMPRESSION / ASSESSMENT AND PLAN / ED COURSE  37 y.o. male with a history of bariatric surgery in 2017 who presents for evaluation of abdominal pain, nausea, vomiting, diarrhea x5 days.  Patient is well-appearing and in no distress, has normal vital signs, abdomen is soft with no distention, positive bowel sounds, mild right lower quadrant tenderness with no rebound or guarding.  Differential diagnoses including viral gastroenteritis versus colitis versus complications from bariatric surgery including internal volvulus or hernia, SBO, kidney stone.  Labs showing normal CBC, CMP, lipase with no leukocytosis.  UA showing WBCs and leuks but no bacteria or nitrites.  Patient has no symptoms of a UTI.  We will send patient for CT abdomen pelvis.  Clinical Course as of Oct 03 1609  Wed Oct 03, 2018  1609 CT with no acute etiologies.  Discussed CT findings and presentation with Dr. Satira Mccallum from surgery who recommended outpatient follow-up with patient's surgeon.  Will provide Zofran for nausea.  Discussed return precautions with patient.   [CV]    Clinical Course User Index [CV] Don Perking [place], MD     As part of my medical decision making, I reviewed the following data within the electronic MEDICAL RECORD NUMBER Nursing notes reviewed and incorporated, Labs reviewed , Old chart reviewed, Radiograph reviewed , A consult was requested and obtained from this/these consultant(s) Surgery, Notes from prior ED visits and [place] Controlled Substance Database    Pertinent labs & imaging results that were available during my care of the  patient were reviewed by me and considered in my medical decision making (see chart for details).    ____________________________________________   FINAL CLINICAL IMPRESSION(S) / ED DIAGNOSES  Final diagnoses:  Nausea vomiting and diarrhea  Right lower quadrant abdominal pain      NEW MEDICATIONS STARTED DURING THIS VISIT:  ED Discharge Orders         Ordered    ondansetron (ZOFRAN ODT) 4 MG disintegrating tablet  Every 8 hours PRN     10/03/18 1609           Note:  This document was prepared using Dragon voice recognition software and may include unintentional dictation errors.    Nita Sickle, MD 10/03/18 [phone]

## 2018-10-03 NOTE — Discharge Instructions (Signed)

## 2018-10-03 NOTE — ED Triage Notes (Signed)
Pt sent by PA for CT to rule out an internal hernia.

## 2018-10-03 NOTE — ED Notes (Signed)
Pt given warm blankets.

## 2018-10-05 LAB — URINE CULTURE: Culture: NO GROWTH

## 2019-01-07 ENCOUNTER — Telehealth: Payer: Self-pay

## 2019-01-07 NOTE — Telephone Encounter (Signed)
Spoke with Pt and he has had cold like symptoms but his daughter just got diagnosed with the flu. Pt's appt cancelled and he will call back to schedule appt.

## 2019-01-08 ENCOUNTER — Ambulatory Visit: Payer: BC Managed Care – PPO | Admitting: Student in an Organized Health Care Education/Training Program

## 2019-01-08 NOTE — Telephone Encounter (Signed)
Thanks for letting us know. 

## 2019-04-09 ENCOUNTER — Other Ambulatory Visit: Payer: Self-pay | Admitting: Urology

## 2019-04-09 DIAGNOSIS — N4611 Organic oligospermia: Secondary | ICD-10-CM

## 2019-04-10 ENCOUNTER — Other Ambulatory Visit: Payer: Self-pay | Admitting: Urology

## 2019-04-10 DIAGNOSIS — E221 Hyperprolactinemia: Secondary | ICD-10-CM

## 2019-04-10 DIAGNOSIS — N4611 Organic oligospermia: Secondary | ICD-10-CM

## 2019-04-10 DIAGNOSIS — N500 Atrophy of testis: Secondary | ICD-10-CM

## 2019-04-29 ENCOUNTER — Ambulatory Visit
Admission: RE | Admit: 2019-04-29 | Discharge: 2019-04-29 | Disposition: A | Discharge status: Home / Self Care | Payer: 59 | Source: Ambulatory Visit | Attending: Urology | Admitting: Urology

## 2019-04-29 ENCOUNTER — Other Ambulatory Visit: Payer: Self-pay

## 2019-04-29 DIAGNOSIS — N4611 Organic oligospermia: Secondary | ICD-10-CM

## 2019-04-29 DIAGNOSIS — E221 Hyperprolactinemia: Secondary | ICD-10-CM | POA: Diagnosis present

## 2019-04-29 DIAGNOSIS — N500 Atrophy of testis: Secondary | ICD-10-CM | POA: Diagnosis present

## 2019-04-29 MED ORDER — GADOBUTROL 1 MMOL/ML IV SOLN
9.0000 mL | Freq: Once | INTRAVENOUS | Status: AC | PRN
  Administered 2019-04-29: 9 mL via INTRAVENOUS

## 2024-03-05 ENCOUNTER — Other Ambulatory Visit: Payer: Self-pay | Admitting: Medical Genetics

## 2024-03-15 ENCOUNTER — Other Ambulatory Visit (HOSPITAL_COMMUNITY): Payer: Self-pay

## 2024-06-12 ENCOUNTER — Encounter: Payer: Self-pay | Admitting: Neurology

## 2024-08-13 ENCOUNTER — Other Ambulatory Visit: Payer: Self-pay | Admitting: Medical Genetics

## 2024-08-13 DIAGNOSIS — Z006 Encounter for examination for normal comparison and control in clinical research program: Secondary | ICD-10-CM

## 2024-08-26 ENCOUNTER — Ambulatory Visit: Admitting: Neurology

## 2024-09-06 ENCOUNTER — Emergency Department (HOSPITAL_BASED_OUTPATIENT_CLINIC_OR_DEPARTMENT_OTHER)
Admission: EM | Admit: 2024-09-06 | Discharge: 2024-09-06 | Disposition: A | Discharge status: Home / Self Care | Attending: Emergency Medicine | Admitting: Emergency Medicine

## 2024-09-06 ENCOUNTER — Other Ambulatory Visit: Payer: Self-pay

## 2024-09-06 ENCOUNTER — Encounter (HOSPITAL_BASED_OUTPATIENT_CLINIC_OR_DEPARTMENT_OTHER): Payer: Self-pay | Admitting: Emergency Medicine

## 2024-09-06 ENCOUNTER — Emergency Department (HOSPITAL_BASED_OUTPATIENT_CLINIC_OR_DEPARTMENT_OTHER)

## 2024-09-06 DIAGNOSIS — M79605 Pain in left leg: Secondary | ICD-10-CM | POA: Diagnosis present

## 2024-09-06 DIAGNOSIS — L03116 Cellulitis of left lower limb: Secondary | ICD-10-CM | POA: Diagnosis not present

## 2024-09-06 LAB — COMPREHENSIVE METABOLIC PANEL WITH GFR
ALT: 43 U/L (ref 0–44)
AST: 36 U/L (ref 15–41)
Albumin: 3.9 g/dL (ref 3.5–5.0)
Alkaline Phosphatase: 92 U/L (ref 38–126)
Anion gap: 8 (ref 5–15)
BUN: 13 mg/dL (ref 6–20)
CO2: 26 mmol/L (ref 22–32)
Calcium: 8.6 mg/dL — ABNORMAL LOW (ref 8.9–10.3)
Chloride: 105 mmol/L (ref 98–111)
Creatinine, Ser: 0.63 mg/dL (ref 0.61–1.24)
GFR, Estimated: >60 mL/min (ref >60)
Glucose, Bld: 98 mg/dL (ref 70–99)
Potassium: 5.1 mmol/L (ref 3.5–5.1)
Sodium: 138 mmol/L (ref 135–145)
Total Bilirubin: 0.5 mg/dL (ref 0.0–1.2)
Total Protein: 6.4 g/dL — ABNORMAL LOW (ref 6.5–8.1)

## 2024-09-06 LAB — CBC WITH DIFFERENTIAL/PLATELET
Abs Immature Granulocytes: 0.03 K/uL (ref 0.00–0.07)
Basophils Absolute: 0.1 K/uL (ref 0.0–0.1)
Basophils Relative: 1 %
Eosinophils Absolute: 0.2 K/uL (ref 0.0–0.5)
Eosinophils Relative: 3 %
HCT: 37.5 % — ABNORMAL LOW (ref 39.0–52.0)
Hemoglobin: 12.4 g/dL — ABNORMAL LOW (ref 13.0–17.0)
Immature Granulocytes: 1 %
Lymphocytes Relative: 46 %
Lymphs Abs: 2.7 K/uL (ref 0.7–4.0)
MCH: 30.3 pg (ref 26.0–34.0)
MCHC: 33.1 g/dL (ref 30.0–36.0)
MCV: 91.7 fL (ref 80.0–100.0)
Monocytes Absolute: 0.5 K/uL (ref 0.1–1.0)
Monocytes Relative: 9 %
Neutro Abs: 2.3 K/uL (ref 1.7–7.7)
Neutrophils Relative %: 40 %
Platelets: 194 K/uL (ref 150–400)
RBC: 4.09 MIL/uL — ABNORMAL LOW (ref 4.22–5.81)
RDW: 13.8 % (ref 11.5–15.5)
WBC: 5.8 K/uL (ref 4.0–10.5)
nRBC: 0 % (ref 0.0–0.2)

## 2024-09-06 MED ORDER — CEPHALEXIN 500 MG PO CAPS: 500.0000 mg | ORAL_CAPSULE | Freq: Four times a day (QID) | ORAL | 0 refills | Status: AC

## 2024-09-06 NOTE — ED Triage Notes (Addendum)
 Pt with left upper thigh pain/redness.  Painful to touch and to bear weight.  Pt reports he is concerned about a blood clot.  No fever chills or shob.  Reports began as a small bruise and now has an oddly shaped reddened area.

## 2024-09-06 NOTE — Discharge Instructions (Signed)
 You were seen in the emergency department today for concerns of leg pain.  The ultrasound that you performed was negative for any signs of a blood clot in this leg.  Your area of redness however is concerning for possible infection, this is called cellulitis.  I am starting you on a course of antibiotics to treat for this and recommend he complete them in their entirety even if your symptoms improve or resolve.  Please follow-up closely with your primary care provider.  Return to the emergency department for any concerns of new or worsening symptoms.

## 2024-09-06 NOTE — ED Provider Notes (Addendum)
 [facility] Provider Note   CSN: [id number] Arrival date & time: 09/06/24  1801     Patient presents with: Leg Pain   Drew [name] is a 43 y.o. male.  Patient with past history significant for anemia, migraines presents to the emergency department concerns of leg pain.  Reports that he began to notice pain and redness to the left upper thigh several days ago and is concerned for a clot after striking his leg on his bed.  He reports that the area is painful to the touch and some difficulty with weightbearing.  States that he initially noticed a small bruise that was oddly shaped and the area has now reddened with what he believes to be streaking.  No reported fever, chills or bodyaches.   Leg Pain      Prior to Admission medications   Medication Sig Start Date End Date Taking? Authorizing Provider  cephALEXin (KEFLEX) 500 MG capsule Take 1 capsule (500 mg total) by mouth 4 (four) times daily. 09/06/24  Yes Astin Sayre A, PA-C  albuterol (PROVENTIL HFA;VENTOLIN HFA) 108 (90 BASE) MCG/ACT inhaler Inhale 2 puffs into the lungs every 6 (six) hours as needed. For asthma    [provider]  amoxicillin -clavulanate (AUGMENTIN ) 875-125 MG tablet Take 1 tablet by mouth 2 (two) times daily. 12/30/17   Frann Mabel Mt, DO  etodolac (LODINE) 400 MG tablet Take 400 mg by mouth 2 (two) times daily.    [provider]  Omega-3 Fatty Acids (FISH OIL PO) Take 1 capsule by mouth daily.    [provider]  ondansetron  (ZOFRAN  ODT) 4 MG disintegrating tablet Take 1 tablet (4 mg total) by mouth every 8 (eight) hours as needed. 10/03/18   Edelmiro Leash, MD  Pamabrom ([employer] DIURETIC MAXIMUM STRENGTH PO) Take 1 tablet by mouth daily as needed. For water retention.    [provider]    Allergies: Nsaids and Oxycodone    Review of Systems  Cardiovascular:  Positive for leg swelling.  All other systems reviewed and  are negative.   Updated Vital Signs BP 133/76   Pulse 67   Temp 98.1 F (36.7 C) (Oral)   Resp 19   SpO2 100%   Physical Exam Vitals and nursing note reviewed.  Constitutional:      General: He is not in acute distress.    Appearance: He is well-developed.  HENT:     Head: Normocephalic and atraumatic.  Eyes:     Conjunctiva/sclera: Conjunctivae normal.  Cardiovascular:     Rate and Rhythm: Normal rate and regular rhythm.     Heart sounds: No murmur heard. Pulmonary:     Effort: Pulmonary effort is normal. No respiratory distress.     Breath sounds: Normal breath sounds.  Abdominal:     Palpations: Abdomen is soft.     Tenderness: There is no abdominal tenderness.  Musculoskeletal:        General: No swelling.     Cervical back: Neck supple.  Skin:    General: Skin is warm and dry.     Capillary Refill: Capillary refill takes less than 2 seconds.     Findings: Bruising and erythema present.         Comments: Bruising and swelling to the left thigh in an irregular but slightly linear fashion. See attached images.  There is no appreciable purulence to the area of erythema to the left upper leg.  Neurological:  Mental Status: He is alert.  Psychiatric:        Mood and Affect: Mood normal.         (all labs ordered are listed, but only abnormal results are displayed) Labs Reviewed  CBC WITH DIFFERENTIAL/PLATELET - Abnormal; Notable for the following components:      Result Value   RBC 4.09 (*)    Hemoglobin 12.4 (*)    HCT 37.5 (*)    All other components within normal limits  COMPREHENSIVE METABOLIC PANEL WITH GFR - Abnormal; Notable for the following components:   Calcium 8.6 (*)    Total Protein 6.4 (*)    All other components within normal limits    EKG: None  Radiology: US  Venous Img Lower  Left (DVT Study) Result Date: 09/06/2024 EXAM: ULTRASOUND DUPLEX OF THE LEFT LOWER EXTREMITY VEINS TECHNIQUE: Duplex ultrasound using B-mode/gray scaled  imaging and Doppler spectral analysis and color flow was obtained of the deep venous structures of the left lower extremity. COMPARISON: None available. CLINICAL HISTORY: Leg pain and swelling FINDINGS: The common femoral vein, femoral vein, popliteal vein, and posterior tibial vein of the _laterality_ lower extremity demonstrate normal compressibility with normal color flow and spectral analysis. IMPRESSION: 1. No evidence of DVT. Electronically signed by: Pinkie Pebbles MD 09/06/2024 07:40 PM EST RP Workstation: [id number]     Procedures   Medications Ordered in the ED - No data to display                                  Medical Decision Making Amount and/or Complexity of Data Reviewed Labs: ordered.  Risk Prescription drug management.   This patient presents to the ED for concern of leg pain, swelling.  Differential diagnosis includes cellulitis, DVT, edema, lymphadenitis    Additional history obtained:  Additional history obtained from epic chart review   Lab Tests:  I Ordered, and personally interpreted labs.  The pertinent results include: CBC without significant cytosis and hemoglobin at 12.4, CMP unremarkable   Imaging Studies ordered:  I ordered imaging studies including ultrasound of the left lower extremity I independently visualized and interpreted imaging which showed negative for any findings to suggest DVT or other abnormality I agree with the radiologist interpretation   Problem List / ED Course:  Patient presented to the emergency department today with concerns of leg pain.  Reports that he began to experience left upper thigh pain and redness a few days ago with increasing pain with some difficulty bearing weight.  Denies any falls.  States that he struck his leg against a bed frame and developed a small bruise that has now developed into this.  No reported fever, chills, body aches, or shortness of breath.  He is not current on any blood  thinner. Physical exam reveals an area of appears to be discolored skin more consistent with bruising with a brownish hue to it but there is some redness present and slight warmth.  There is some slight indurated skin in the left upper thigh as well which may be concerning for possible cellulitis.  Patient has no prior history of PE or DVT and is not currently anticoagulated. Patient's lab workup is unremarkable.  There is no evident leukocytosis.  Ultrasound of the left lower extremity reveals no signs of DVT.  Based on workup, suspect likely cellulitic changes to the skin causing his area of pain, redness/discoloration.  He is well-appearing at  this time do not feel he require admission but will initiate antibiotic treatment to be taken outpatient.  Advise close follow-up with PCP and strict return precautions discussed.  He is otherwise stable for outpatient follow-up and discharged home.   Social Determinants of Health:  None  Final diagnoses:  Cellulitis of left lower extremity    ED Discharge Orders          Ordered    cephALEXin (KEFLEX) 500 MG capsule  4 times daily        09/06/24 2044               Nashayla Telleria A, PA-C 09/06/24 2121    Shayne Deerman A, PA-C 09/06/24 2122    Lenor Hollering, MD 09/06/24 2247
